# Patient Record
Sex: Male | Born: 2000 | Race: White | Hispanic: No | Marital: Single | Smoking: Never smoker
Health system: Southern US, Community
[De-identification: ages and names within clinical notes are randomized; demographics above are authoritative.]

## PROBLEM LIST (undated history)

## (undated) DIAGNOSIS — F419 Anxiety disorder, unspecified: Secondary | ICD-10-CM

## (undated) DIAGNOSIS — F32A Depression, unspecified: Secondary | ICD-10-CM

## (undated) DIAGNOSIS — F909 Attention-deficit hyperactivity disorder, unspecified type: Secondary | ICD-10-CM

## (undated) DIAGNOSIS — Z789 Other specified health status: Secondary | ICD-10-CM

## (undated) DIAGNOSIS — F913 Oppositional defiant disorder: Secondary | ICD-10-CM

## (undated) DIAGNOSIS — S62109A Fracture of unspecified carpal bone, unspecified wrist, initial encounter for closed fracture: Secondary | ICD-10-CM

## (undated) DIAGNOSIS — F39 Unspecified mood [affective] disorder: Secondary | ICD-10-CM

## (undated) DIAGNOSIS — F411 Generalized anxiety disorder: Secondary | ICD-10-CM

## (undated) DIAGNOSIS — F329 Major depressive disorder, single episode, unspecified: Secondary | ICD-10-CM

## (undated) HISTORY — DX: Fracture of unspecified carpal bone, unspecified wrist, initial encounter for closed fracture: S62.109A

## (undated) HISTORY — DX: Oppositional defiant disorder: F91.3

## (undated) HISTORY — DX: Unspecified mood (affective) disorder: F39

## (undated) HISTORY — DX: Attention-deficit hyperactivity disorder, unspecified type: F90.9

## (undated) HISTORY — DX: Generalized anxiety disorder: F41.1

---
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
## 2021-10-06 ENCOUNTER — Ambulatory Visit
Admission: RE | Admit: 2021-10-06 | Discharge: 2021-10-06 | Disposition: A | Discharge status: Home / Self Care | Payer: Managed Care, Other (non HMO) | Source: Ambulatory Visit | Attending: Internal Medicine | Admitting: Internal Medicine

## 2021-10-06 ENCOUNTER — Other Ambulatory Visit: Payer: Self-pay | Admitting: Internal Medicine

## 2021-10-06 DIAGNOSIS — M79605 Pain in left leg: Secondary | ICD-10-CM

## 2023-02-06 IMAGING — CR DG FEMUR 2+V*L*
4 series · 4 of 4 positions shown · non-contrast
Comparison: None Available.

CLINICAL DATA: Leg pain

EXAM:
LEFT FEMUR 2 VIEWS

[t femur with hip  ap left]
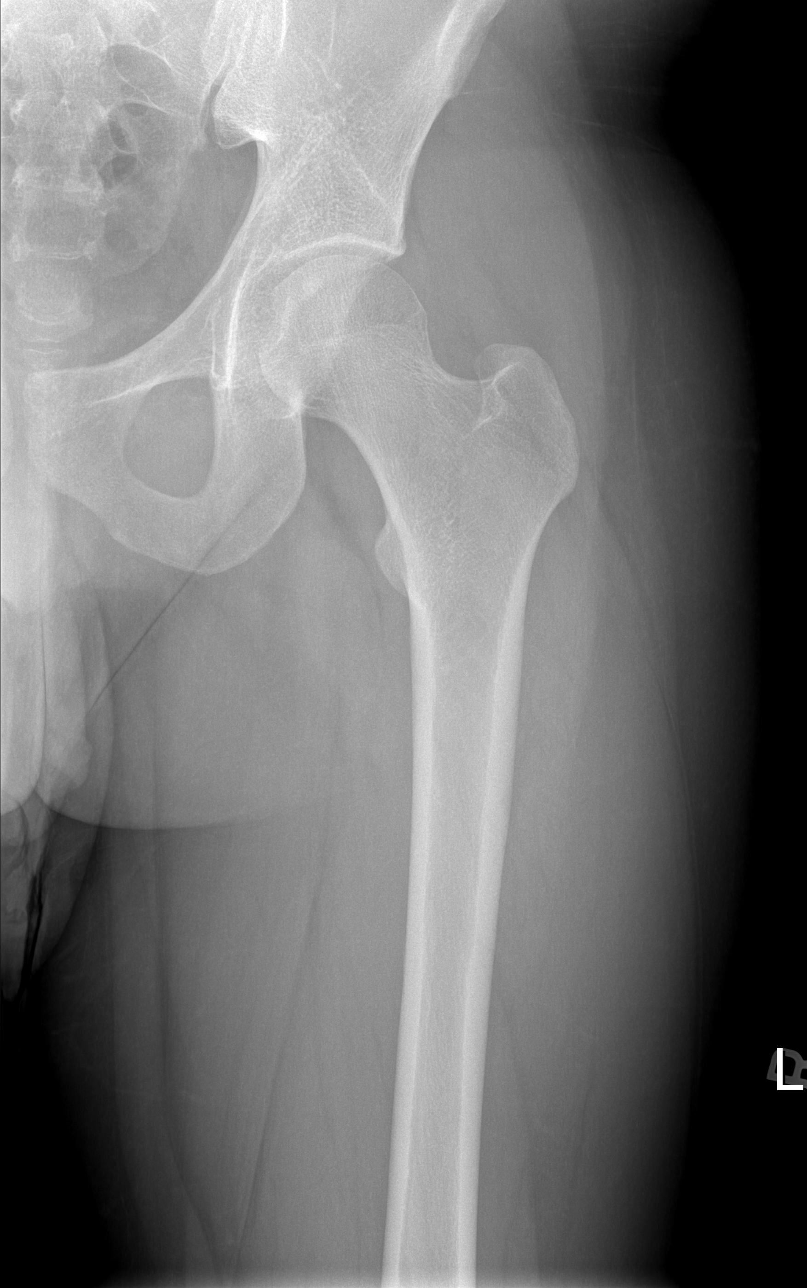

[t femur with knee ap left]
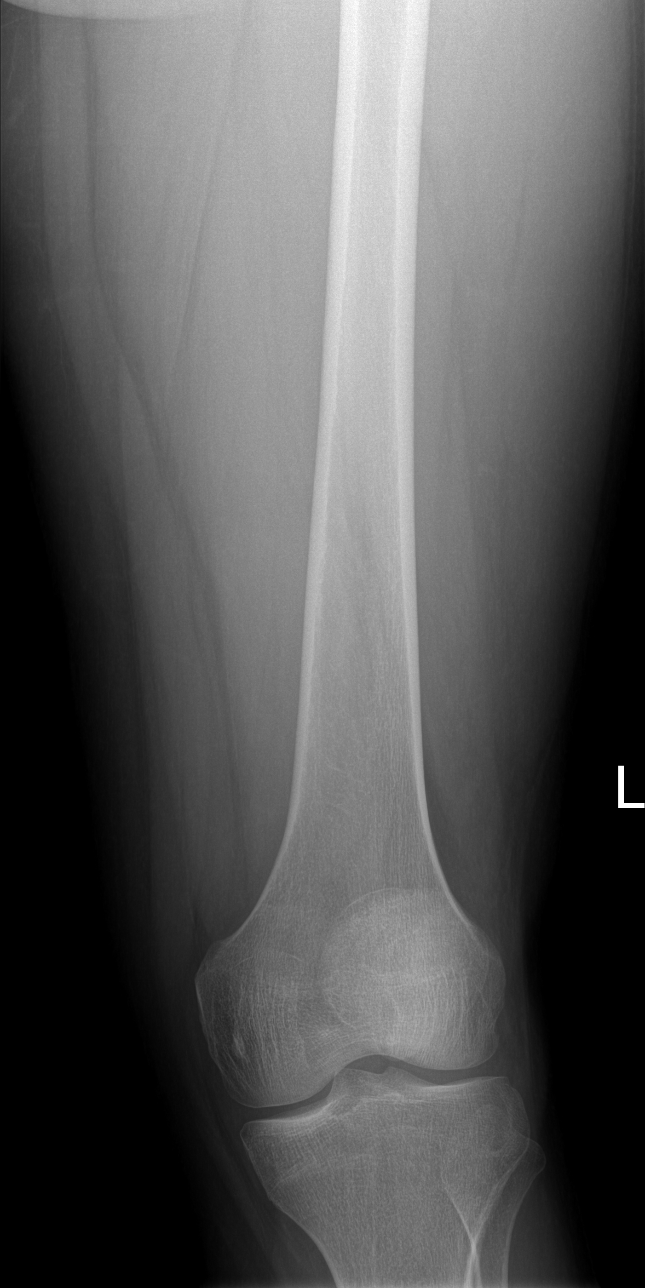

[t femur with hip lat left]
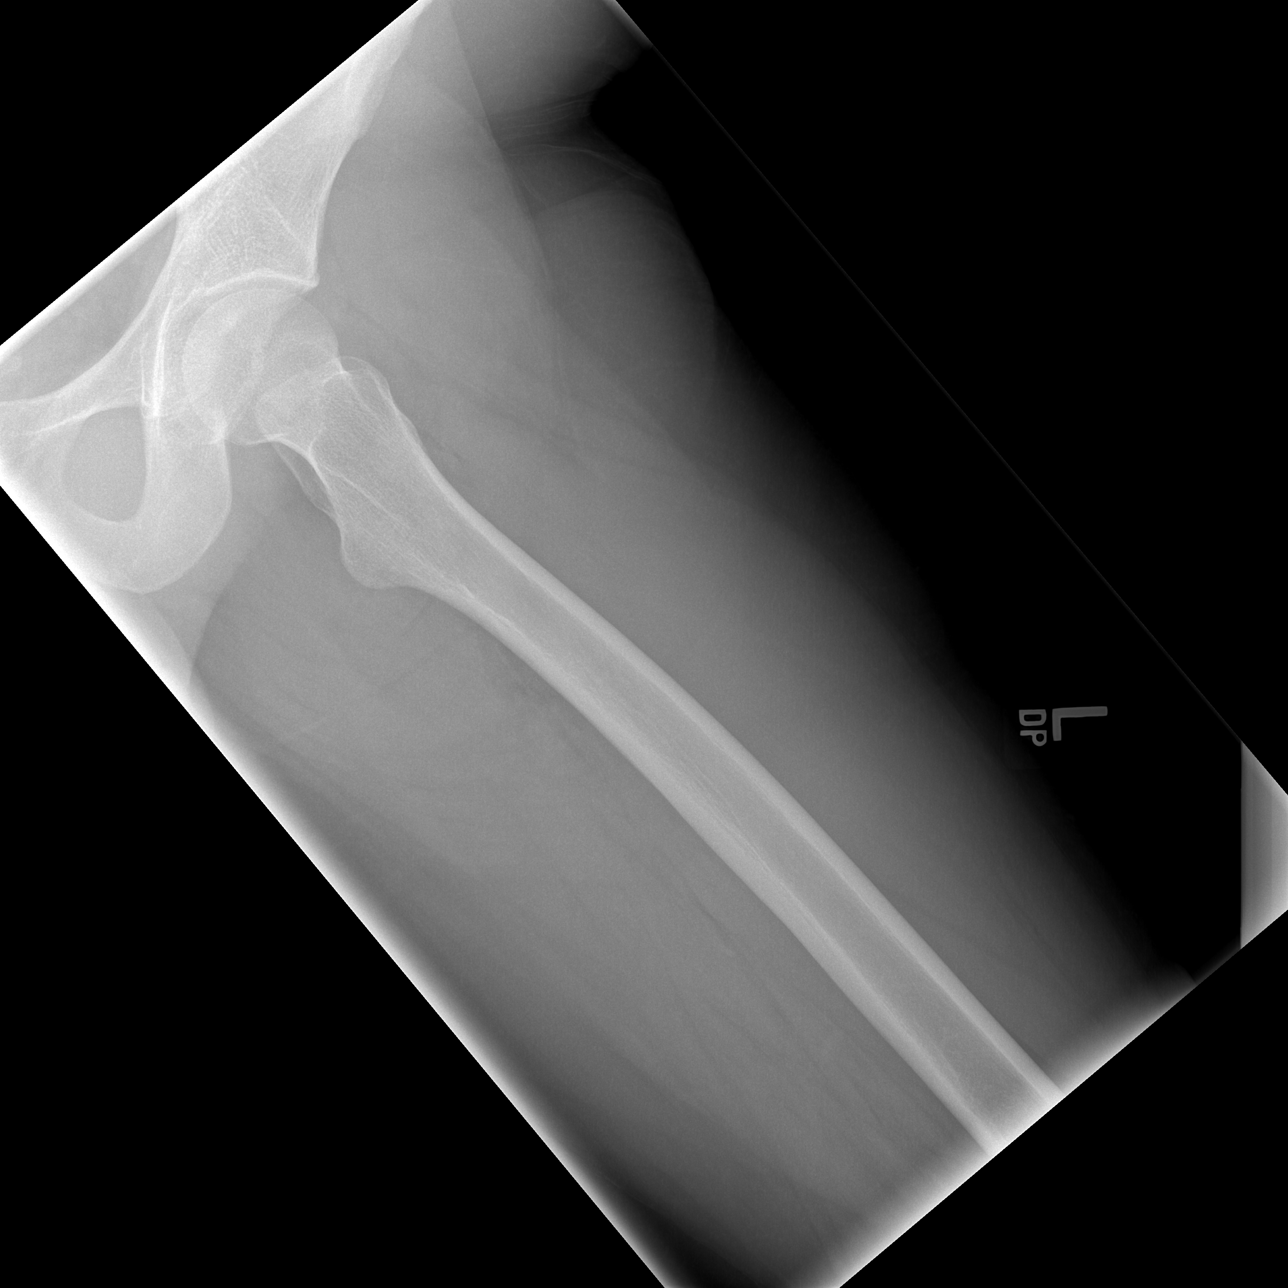

[t femur with knee lat left]
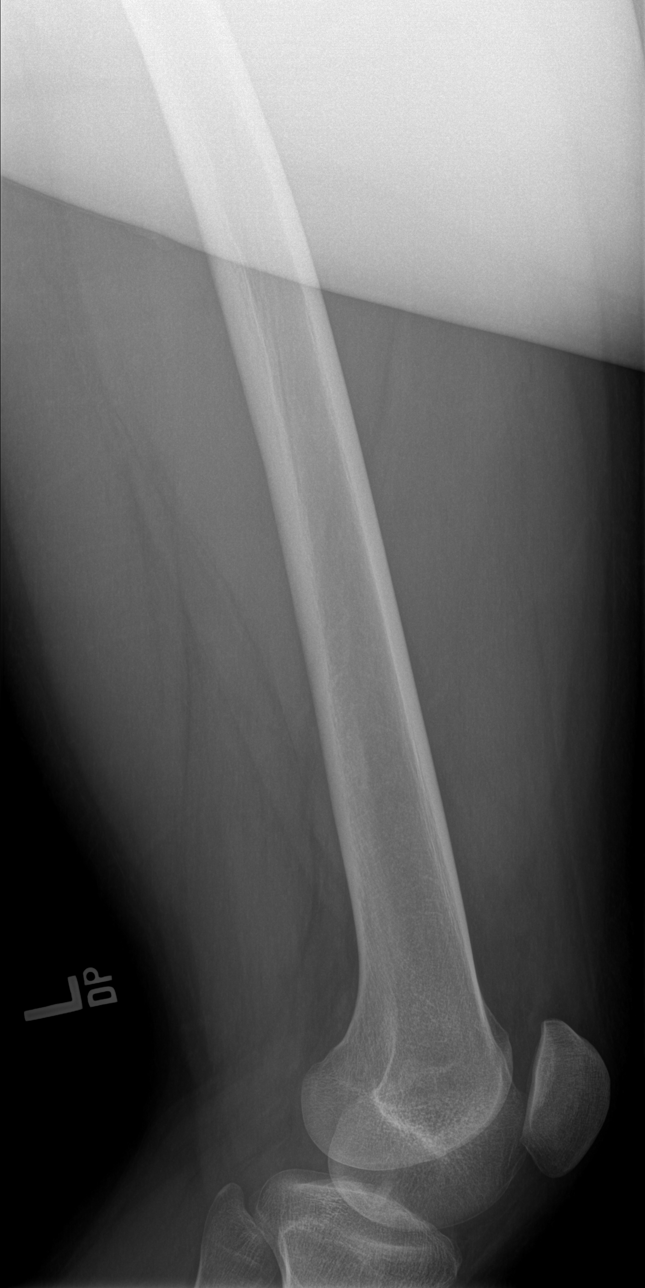

[4 of 4 positions shown; findings below may reference images not displayed]

FINDINGS: There is no evidence of fracture or other focal bone lesions. Soft
tissues are unremarkable.
IMPRESSION: Negative.
